# Patient Record
Sex: Male | Born: 1965 | Race: White | Hispanic: No
Health system: Southern US, Community
[De-identification: ages and names within clinical notes are randomized; demographics above are authoritative.]

---

## 2011-06-12 ENCOUNTER — Emergency Department (HOSPITAL_COMMUNITY): Admission: EM | Admit: 2011-06-12 | Payer: Self-pay | Source: Home / Self Care

## 2014-08-23 ENCOUNTER — Emergency Department: Payer: Self-pay | Admitting: Internal Medicine

## 2014-08-23 LAB — TROPONIN I: Troponin-I: <0.02 ng/mL

## 2014-08-23 LAB — COMPREHENSIVE METABOLIC PANEL
ALBUMIN: 3.9 g/dL (ref 3.4–5.0)
ALK PHOS: 65 U/L
AST: 33 U/L (ref 15–37)
Anion Gap: 4 — ABNORMAL LOW (ref 7–16)
BILIRUBIN TOTAL: 0.6 mg/dL (ref 0.2–1.0)
BUN: 16 mg/dL (ref 7–18)
CALCIUM: 8.7 mg/dL (ref 8.5–10.1)
CO2: 32 mmol/L (ref 21–32)
Chloride: 105 mmol/L (ref 98–107)
Creatinine: 1.04 mg/dL (ref 0.60–1.30)
EGFR (African American): >60
Glucose: 77 mg/dL (ref 65–99)
Osmolality: 281 (ref 275–301)
Potassium: 4.2 mmol/L (ref 3.5–5.1)
SGPT (ALT): 51 U/L
Sodium: 141 mmol/L (ref 136–145)
Total Protein: 8.1 g/dL (ref 6.4–8.2)

## 2014-08-23 LAB — D-DIMER(ARMC): D-Dimer: 247 ng/ml

## 2014-08-23 LAB — CBC
HCT: 48.7 % (ref 40.0–52.0)
HGB: 16.1 g/dL (ref 13.0–18.0)
MCH: 31.7 pg (ref 26.0–34.0)
MCHC: 33 g/dL (ref 32.0–36.0)
MCV: 96 fL (ref 80–100)
PLATELETS: 147 10*3/uL — AB (ref 150–440)
RBC: 5.08 10*6/uL (ref 4.40–5.90)
RDW: 13.1 % (ref 11.5–14.5)
WBC: 5.5 10*3/uL (ref 3.8–10.6)

## 2020-01-24 ENCOUNTER — Ambulatory Visit: Payer: Self-pay | Attending: Internal Medicine

## 2020-01-24 ENCOUNTER — Other Ambulatory Visit: Payer: Self-pay

## 2020-01-24 DIAGNOSIS — Z23 Encounter for immunization: Secondary | ICD-10-CM

## 2020-01-24 NOTE — Progress Notes (Signed)
   Covid-[facility] Vaccination [facility]  Name:  Elijah Morrow    MRN: [id number] DOB: 1966/06/11  01/24/2020  Mr. Elijah Morrow was observed post Covid-19 immunization for 15 minutes without incident. He was provided with Vaccine Information Sheet and instruction to access the V-Safe system.   Mr. Elijah Morrow was instructed to call 911 with any severe reactions post vaccine: Marland Kitchen Difficulty breathing  . Swelling of face and throat  . A fast heartbeat  . A bad rash all over body  . Dizziness and weakness   Immunizations Administered    Name Date Dose VIS Date Route   Pfizer COVID-19 Vaccine 01/24/2020  9:30 AM 0.3 mL 09/20/2019 Intramuscular   Manufacturer: [employer], [employer]   Lot: [id number]   NDC: [address]-5

## 2020-02-19 ENCOUNTER — Ambulatory Visit: Payer: Self-pay | Attending: Internal Medicine

## 2020-02-19 DIAGNOSIS — Z23 Encounter for immunization: Secondary | ICD-10-CM

## 2020-02-19 NOTE — Progress Notes (Signed)
   Covid-19 Vaccination Clinic  Name:  Elijah Morrow    MRN: [id number] DOB: [birth year]  02/19/2020  Mr. Elijah Morrow was observed post Covid-19 immunization for 15 minutes without incident. He was provided with Vaccine Information Sheet and instruction to access the V-Safe system.   Mr. Elijah Morrow was instructed to call 911 with any severe reactions post vaccine: Marland Kitchen Difficulty breathing  . Swelling of face and throat  . A fast heartbeat  . A bad rash all over body  . Dizziness and weakness   Immunizations Administered    Name Date Dose VIS Date Route   Pfizer COVID-19 Vaccine 02/19/2020  8:48 AM 0.3 mL 12/04/2018 Intramuscular   Manufacturer: [employer], [employer]   Lot: [id number]   NDC: [address]-5

## 2020-10-22 ENCOUNTER — Other Ambulatory Visit: Payer: Self-pay | Admitting: Internal Medicine

## 2020-10-22 DIAGNOSIS — Z8616 Personal history of COVID-19: Secondary | ICD-10-CM

## 2020-10-22 DIAGNOSIS — R079 Chest pain, unspecified: Secondary | ICD-10-CM

## 2020-10-29 ENCOUNTER — Ambulatory Visit: Payer: PRIVATE HEALTH INSURANCE

## 2020-10-29 ENCOUNTER — Ambulatory Visit: Payer: Self-pay

## 2020-11-19 ENCOUNTER — Ambulatory Visit: Payer: PRIVATE HEALTH INSURANCE

## 2020-11-19 ENCOUNTER — Other Ambulatory Visit: Payer: Self-pay

## 2020-11-19 ENCOUNTER — Ambulatory Visit
Admission: RE | Admit: 2020-11-19 | Discharge: 2020-11-19 | Disposition: A | Discharge status: Home / Self Care | Payer: Self-pay | Source: Ambulatory Visit | Attending: Internal Medicine | Admitting: Internal Medicine

## 2020-11-19 DIAGNOSIS — R079 Chest pain, unspecified: Secondary | ICD-10-CM | POA: Insufficient documentation

## 2020-11-19 DIAGNOSIS — Z8616 Personal history of COVID-19: Secondary | ICD-10-CM | POA: Insufficient documentation

## 2020-11-19 LAB — POCT I-STAT CREATININE: Creatinine, Ser: 0.9 mg/dL (ref 0.61–1.24)

## 2020-11-19 MED ORDER — IOHEXOL 350 MG/ML SOLN
75.0000 mL | Freq: Once | INTRAVENOUS | Status: AC | PRN
  Administered 2020-11-19: 75 mL via INTRAVENOUS

## 2022-07-30 IMAGING — CT CT ANGIO CHEST
3 of 6 series · 18 of 46 positions shown · IV contrast (APPLIED)
Comparison: None.

CLINICAL DATA: 54-year-old male with history of periodically chest
pain since COVID infection in February 2019

EXAM:
CT ANGIOGRAPHY CHEST WITH CONTRAST
TECHNIQUE: Multidetector CT imaging of the chest was performed using the
standard protocol during bolus administration of intravenous
contrast. Multiplanar CT image reconstructions and MIPs were
obtained to evaluate the vascular anatomy.
CONTRAST:  Seventy-five mL Omnipaque 350, intravenous

[Series 4: axial arterial · axial · arterial · 0.81mm/px · z∈[-400,-133]mm · 11 of 107 slices shown]
[im 9/107  lung]
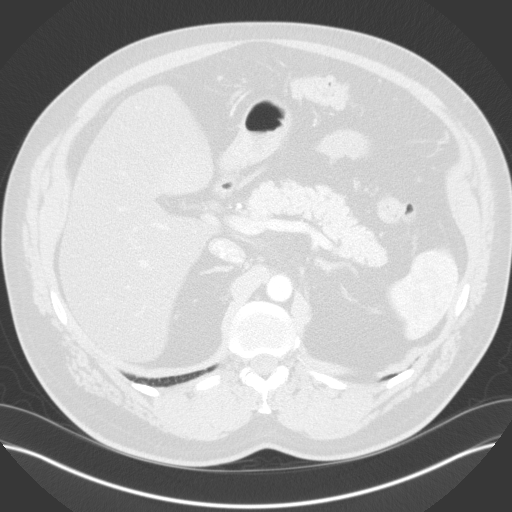
[im 18/107  soft-tissue]
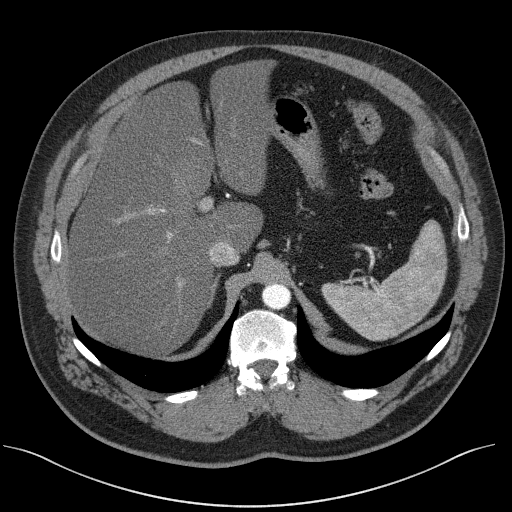
[im 27/107  lung]
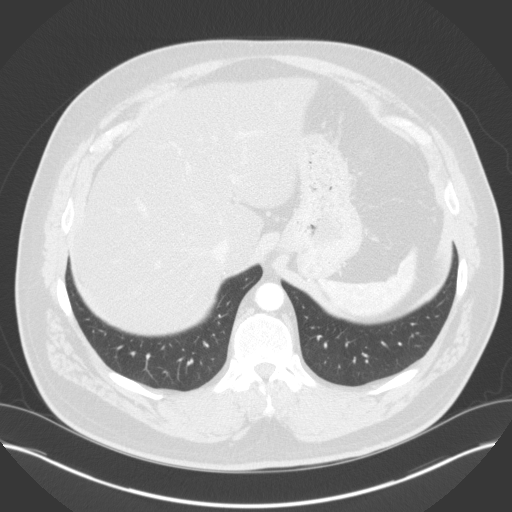
[im 36/107  soft-tissue]
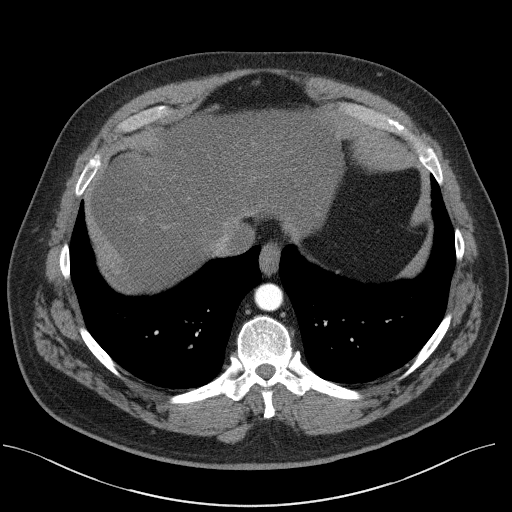
[im 45/107  lung]
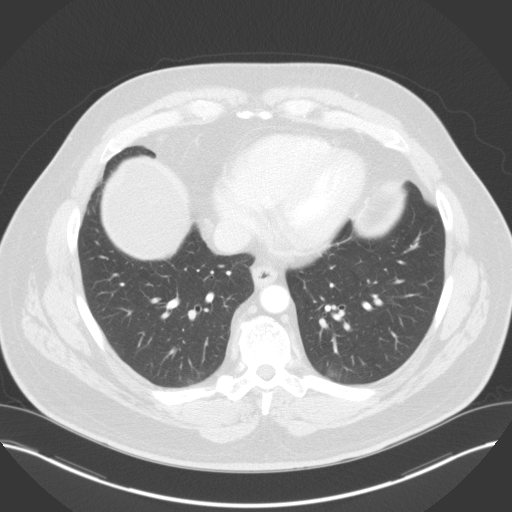
[im 54/107  soft-tissue]
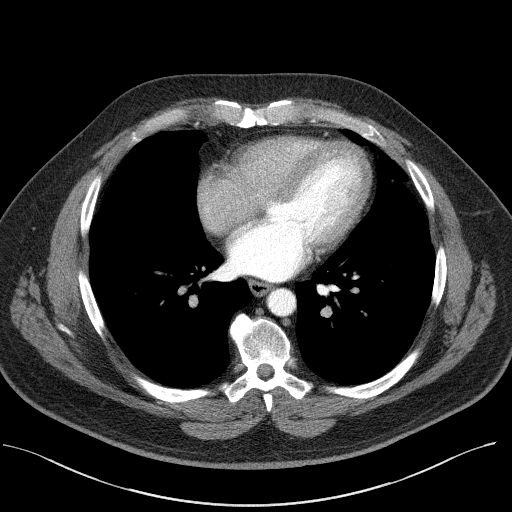
[im 62/107  lung]
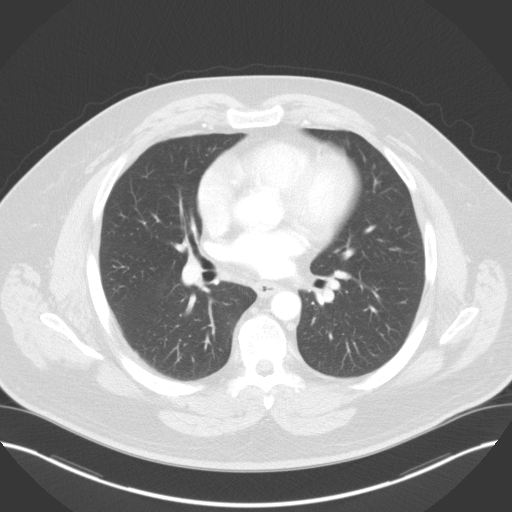
[im 71/107  soft-tissue]
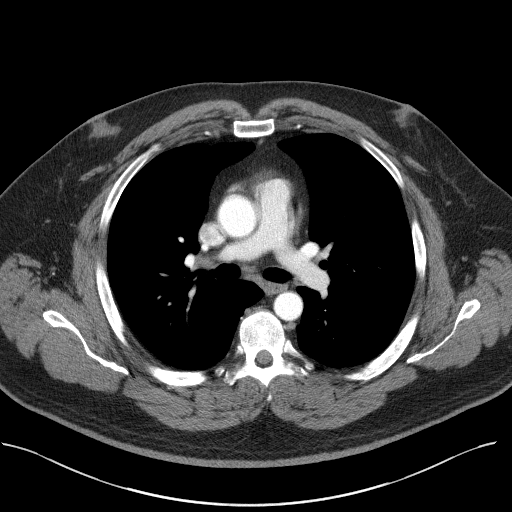
[im 80/107  lung]
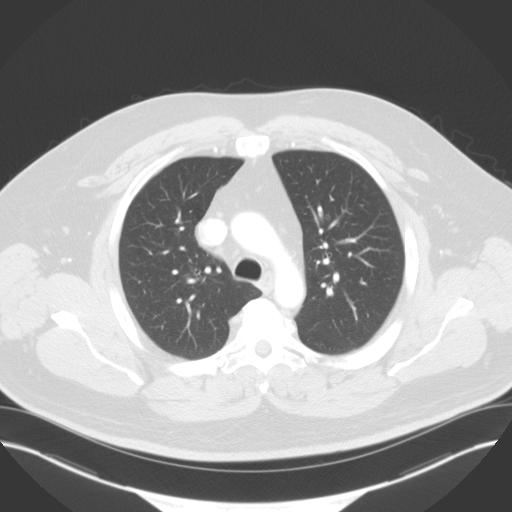
[im 89/107  soft-tissue]
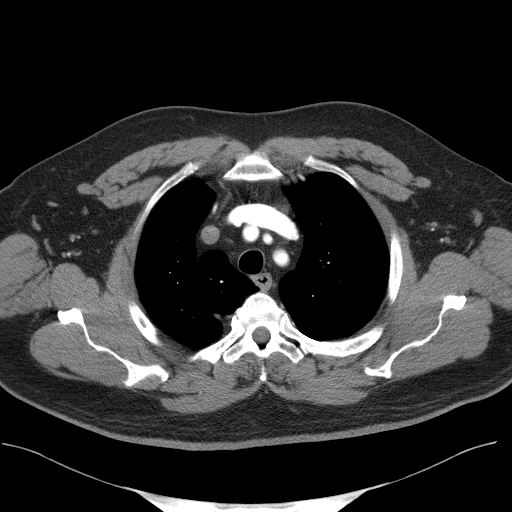
[im 98/107  lung]
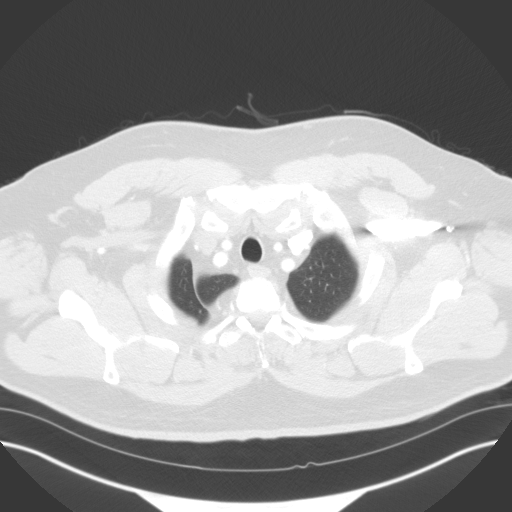

[Series 5: lung · axial · 0.81mm/px · z∈[-390,-284]mm · 4 of 160 slices shown]
[im 18/160  soft-tissue]
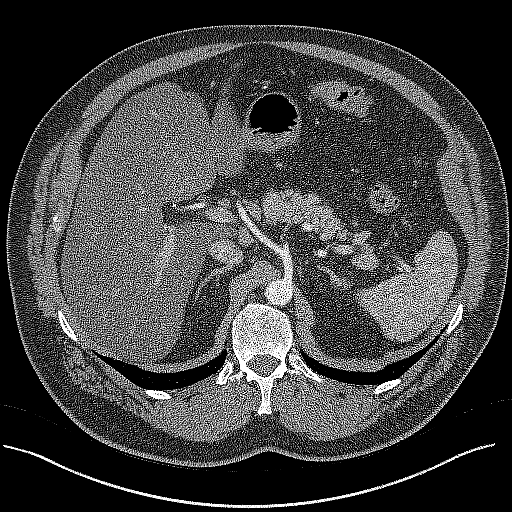
[im 36/160  soft-tissue]
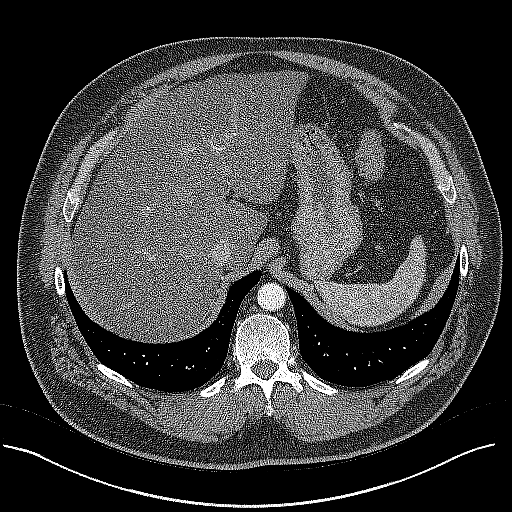
[im 54/160  soft-tissue]
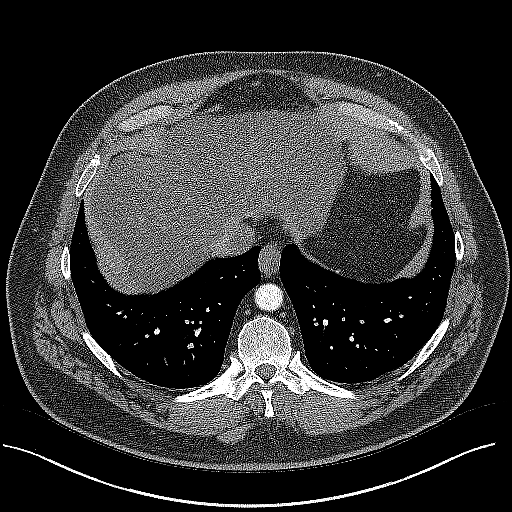
[im 71/160  soft-tissue]
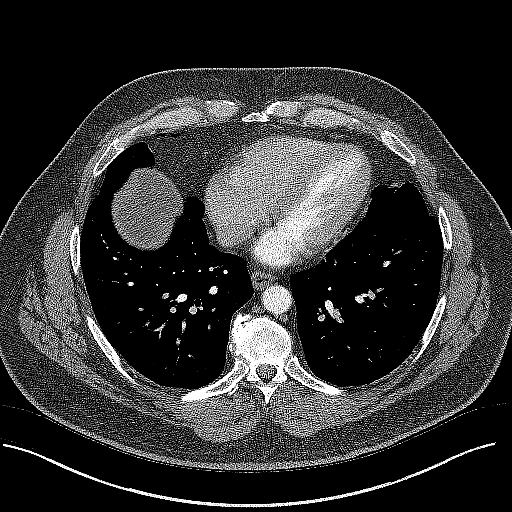

[Series 7: coronal · coronal · 0.64mm/px · 3 of 110 slices shown]
[im 28/110  soft-tissue]
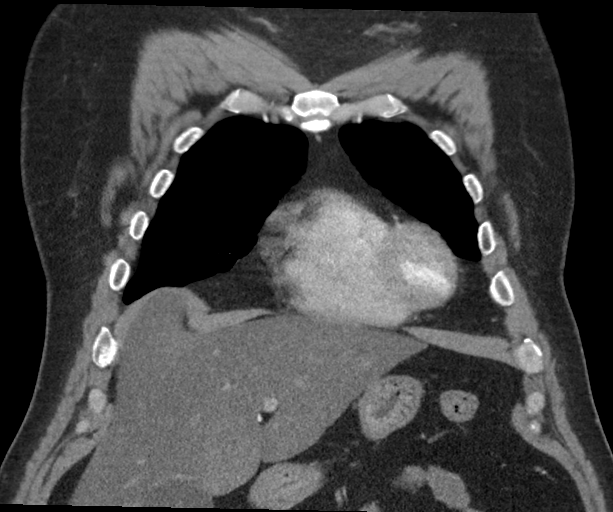
[im 55/110  soft-tissue]
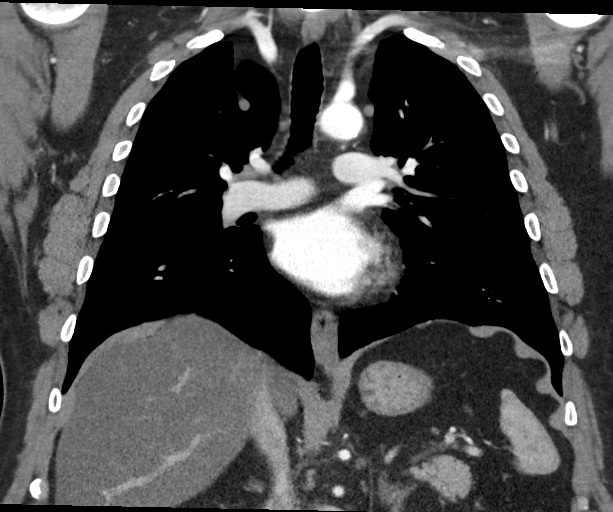
[im 82/110  soft-tissue]
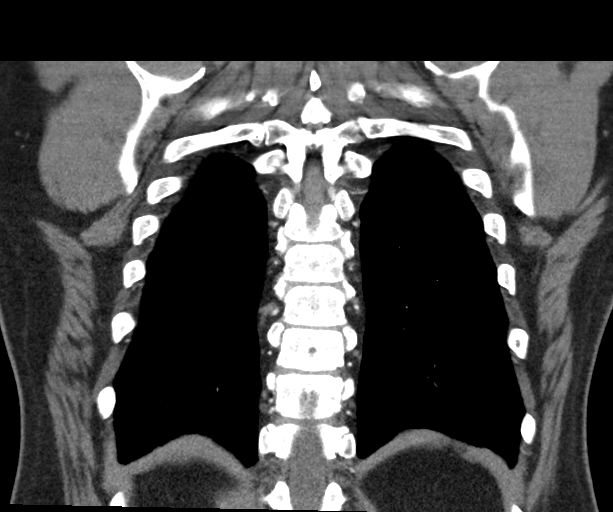

[18 of 46 positions shown; findings below may reference images not displayed]

FINDINGS: Cardiovascular: Preferential opacification of the thoracic aorta. No
evidence of thoracic aortic aneurysm or dissection. Normal heart
size. No pericardial effusion.

Sinues of Valsalva: 22 mm 24 x 23 mm

Sinotubular Junction: 24 mm

Ascending Aorta: 28 mm

Aortic Arch: 26 mm

Descending aorta: 23 mm at the level of the carina

Branch vessels: Conventional branching pattern. No significant
atherosclerotic changes.

Coronary arteries: Normal origins and courses. No significant
atherosclerotic calcifications.

Main pulmonary artery: 19 mm. No evidence of central pulmonary
embolism.

Pulmonary veins: No anomalous pulmonary venous return. No evidence
of left atrial appendage thrombus.

Mediastinum/Nodes: No enlarged mediastinal, hilar, or axillary lymph
nodes. Thyroid gland, trachea, and esophagus demonstrate no
significant findings.

Lungs/Pleura: No focal consolidations. No suspicious pulmonary
nodules. No pleural effusion or pneumothorax.

Upper Abdomen: Diffusely decreased hepatic parenchymal attenuation.
Smooth contour. The remaining visualized upper abdomen is within
normal limits.

Musculoskeletal: Mild multilevel degenerative changes of the
thoracic spine. No acute osseous abnormality or aggressive appearing
osseous lesion.

Review of the MIP images confirms the above findings.
IMPRESSION: Vascular:

No significant thoracic vascular abnormality.

Non-Vascular:

1. Severe hepatic steatosis.
2. No intrathoracic abnormality to explain chest pain.

## 2023-08-30 ENCOUNTER — Other Ambulatory Visit: Payer: Self-pay | Admitting: Internal Medicine

## 2023-08-30 DIAGNOSIS — E1169 Type 2 diabetes mellitus with other specified complication: Secondary | ICD-10-CM

## 2023-08-30 DIAGNOSIS — E782 Mixed hyperlipidemia: Secondary | ICD-10-CM

## 2023-09-14 ENCOUNTER — Other Ambulatory Visit: Payer: PRIVATE HEALTH INSURANCE

## 2023-11-20 ENCOUNTER — Other Ambulatory Visit: Payer: Self-pay | Admitting: Internal Medicine

## 2023-11-20 DIAGNOSIS — E782 Mixed hyperlipidemia: Secondary | ICD-10-CM

## 2023-12-01 ENCOUNTER — Ambulatory Visit
Admission: RE | Admit: 2023-12-01 | Discharge: 2023-12-01 | Disposition: A | Discharge status: Home / Self Care | Payer: Self-pay | Source: Ambulatory Visit | Attending: Internal Medicine | Admitting: Internal Medicine

## 2023-12-01 DIAGNOSIS — E782 Mixed hyperlipidemia: Secondary | ICD-10-CM | POA: Insufficient documentation
